# Patient Record
Sex: Male | Born: 1990 | Race: White | Hispanic: No | Marital: Married | Smoking: Never smoker
Health system: Southern US, Community
[De-identification: ages and names within clinical notes are randomized; demographics above are authoritative.]

## PROBLEM LIST (undated history)

## (undated) DIAGNOSIS — Z87442 Personal history of urinary calculi: Secondary | ICD-10-CM

## (undated) DIAGNOSIS — E7800 Pure hypercholesterolemia, unspecified: Secondary | ICD-10-CM

## (undated) HISTORY — PX: WISDOM TOOTH EXTRACTION: SHX21

---
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
## 2018-04-27 ENCOUNTER — Ambulatory Visit: Payer: Self-pay | Admitting: Physician Assistant

## 2018-05-14 ENCOUNTER — Ambulatory Visit (INDEPENDENT_AMBULATORY_CARE_PROVIDER_SITE_OTHER): Payer: 59 | Admitting: Physician Assistant

## 2018-05-14 ENCOUNTER — Encounter: Payer: Self-pay | Admitting: Physician Assistant

## 2018-05-14 VITALS — BP 120/80 | HR 80 | Temp 98.1°F | Resp 16 | Ht 70.0 in | Wt 236.0 lb

## 2018-05-14 DIAGNOSIS — Z1329 Encounter for screening for other suspected endocrine disorder: Secondary | ICD-10-CM | POA: Diagnosis not present

## 2018-05-14 DIAGNOSIS — Z13220 Encounter for screening for lipoid disorders: Secondary | ICD-10-CM | POA: Diagnosis not present

## 2018-05-14 DIAGNOSIS — Z130 Encounter for screening for diseases of the blood and blood-forming organs and certain disorders involving the immune mechanism: Secondary | ICD-10-CM

## 2018-05-14 DIAGNOSIS — Z0000 Encounter for general adult medical examination without abnormal findings: Secondary | ICD-10-CM

## 2018-05-14 DIAGNOSIS — J020 Streptococcal pharyngitis: Secondary | ICD-10-CM

## 2018-05-14 DIAGNOSIS — Z114 Encounter for screening for human immunodeficiency virus [HIV]: Secondary | ICD-10-CM

## 2018-05-14 DIAGNOSIS — Z23 Encounter for immunization: Secondary | ICD-10-CM

## 2018-05-14 DIAGNOSIS — R21 Rash and other nonspecific skin eruption: Secondary | ICD-10-CM

## 2018-05-14 DIAGNOSIS — Z131 Encounter for screening for diabetes mellitus: Secondary | ICD-10-CM | POA: Diagnosis not present

## 2018-05-14 NOTE — Progress Notes (Signed)
Patient: Adam Wilcox, Male    DOB: 07-Feb-1991, 27 y.o.   MRN: [id number] Visit Date: 05/18/2018  Today's Provider: Trey Sailors, PA-C   Chief Complaint  Patient presents with  . New Patient (Initial Visit)   Subjective:    Annual physical exam  New patient   Patient here today to establish care, patient reports he has not had a PCP since leaving peds.  Patient reports he wants to get checked for thyroid and diabetes. Patient reports his mother has thyroid and diabetes. Lives in [place] with wife of one year. No children. He works in Nurse, learning disability.  Dr. Milford Cage at [facility] - vaccinated at this clinic.   Reports a rash on his lower extremities that has appeared in the last 1-2 months. 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 Does not itch, has never had this rash before. Red bumps on lower legs.   Also reports that he would like referral for tonsillectomy. Says he had recurrent strep throat as a child but then had braces so surgery was delayed and then never happened. Reports he gets strep throat 2 times a year since adolescence.  -----------------------------------------------------------------   Review of Systems  Constitutional: Negative.   HENT: Negative.   Respiratory: Negative.   Cardiovascular: Negative.   Gastrointestinal: Negative.   Endocrine: Negative.   Genitourinary: Negative.   Musculoskeletal: Negative.   Skin: Negative.   Allergic/Immunologic: Negative.   Neurological: Negative.   Hematological: Negative.   Psychiatric/Behavioral: Negative.     Social History      He  reports that he has never smoked. He has never used smokeless tobacco. He reports that he drinks about 3.0 standard drinks of alcohol per week. He reports that he does not use drugs.       Social History   Socioeconomic History  . Marital status: Married    Spouse name: Not on file  . Number of children: Not on file  . Years of education: Not on file  .  Highest education level: Not on file  Occupational History    Employer: [employer]  Social Needs  . Financial resource strain: Not on file  . Food insecurity:    Worry: Not on file    Inability: Not on file  . Transportation needs:    Medical: Not on file    Non-medical: Not on file  Tobacco Use  . Smoking status: Never Smoker  . Smokeless tobacco: Never Used  Substance and Sexual Activity  . Alcohol use: Yes    Alcohol/week: 3.0 standard drinks    Types: 1 Cans of beer, 1 Glasses of wine, 1 Shots of liquor per week  . Drug use: Never  . Sexual activity: Not on file  Lifestyle  . Physical activity:    Days per week: Not on file    Minutes per session: Not on file  . Stress: Not on file  Relationships  . Social connections:    Talks on phone: Not on file    Gets together: Not on file    Attends religious service: Not on file    Active member of club or organization: Not on file    Attends meetings of clubs or organizations: Not on file    Relationship status: Not on file  Other Topics Concern  . Not on file  Social History Narrative  . Not on file    History reviewed. No pertinent past medical history.  There are no active problems to display for this patient.   History reviewed. No pertinent surgical history.  Family History        Family Status  Relation Name Status  . Mother  Alive  . Father  Alive  . MGF  Deceased  . PGF  Deceased  . Brother  Alive        His family history includes Anemia in his mother; Cancer in his maternal grandfather and paternal grandfather; Diabetes in his mother; Thyroid disease in his mother.      No Known Allergies  No current outpatient medications on file.   Patient Care Team: Maryella Shivers as PCP - General (Physician Assistant)      Objective:   Vitals: BP 120/80 (BP Location: Left Arm, Patient Position: Sitting, Cuff Size: Large)   Pulse 80   Temp 98.1 F (36.7 C) (Oral)   Resp 16   Ht 5'  10" (1.778 m)   Wt 236 lb (107 kg)   SpO2 99%   BMI 33.86 kg/m    Vitals:   05/14/18 1509  BP: 120/80  Pulse: 80  Resp: 16  Temp: 98.1 F (36.7 C)  TempSrc: Oral  SpO2: 99%  Weight: 236 lb (107 kg)  Height: 5\' 10"  (1.778 m)     Physical Exam  Constitutional: He is oriented to person, place, and time. He appears well-developed and well-nourished.  HENT:  Right Ear: Tympanic membrane and external ear normal.  Left Ear: Tympanic membrane and external ear normal.  Mouth/Throat: Uvula is midline, oropharynx is clear and moist and mucous membranes are normal. No oropharyngeal exudate.  Cardiovascular: Normal rate and regular rhythm.  Pulmonary/Chest: Effort normal and breath sounds normal.  Abdominal: Soft. Bowel sounds are normal.  Neurological: He is alert and oriented to person, place, and time.  Skin: Skin is warm and dry.  Erythematous papules scattered on lower extremities.   Psychiatric: He has a normal mood and affect. His behavior is normal.     Depression Screen PHQ 2/9 Scores 05/14/2018  PHQ - 2 Score 0  PHQ- 9 Score 1      Assessment & Plan:     Routine Health Maintenance and Physical Exam  Exercise Activities and Dietary recommendations Goals   None     Immunization History  Administered Date(s) Administered  . Hepatitis B, adult 09/21/2017, 10/19/2017, 03/24/2018  . Influenza,inj,Quad PF,6+ Mos 09/24/2016, 05/14/2018  . Td 04/20/2009  . Tdap 04/20/2009    Health Maintenance  Topic Date Due  . TETANUS/TDAP  04/21/2019  . INFLUENZA VACCINE  Completed  . HIV Screening  Completed     Discussed health benefits of physical activity, and encouraged him to engage in regular exercise appropriate for his age and condition.    1. Annual physical exam   2. Diabetes mellitus screening  - Comprehensive Metabolic Panel (CMET)  3. Thyroid disorder screening  - TSH  4. Lipid screening  - Lipid Profile  5. Encounter for screening for  HIV  - HIV antibody (with reflex)  6. Screening for deficiency anemia  - CBC with Differential  7. Strep throat  - Ambulatory referral to ENT  8. Influenza vaccine needed  - Flu Vaccine QUAD 36+ mos IM  9. Rash and nonspecific skin eruption  Possibly contact dermatitis, advised 1% HC cream OTC.   The entirety of the information documented in the History of Present Illness, Review of Systems and Physical Exam were personally obtained by me.  Portions of this information were initially documented by Rondel BatonSulibeya Dimas, CMA and reviewed by me for thoroughness and accuracy.   Return in about 1 year (around 05/15/2019) for CPE.  --------------------------------------------------------------------    Trey SailorsAdriana M Atticus Wedin, PA-C  [facility]

## 2018-05-14 NOTE — Patient Instructions (Signed)

## 2018-05-15 LAB — CBC WITH DIFFERENTIAL/PLATELET
Basophils Absolute: 0 10*3/uL (ref 0.0–0.2)
Basos: 1 %
EOS (ABSOLUTE): 0.2 10*3/uL (ref 0.0–0.4)
Eos: 2 %
Hematocrit: 43.3 % (ref 37.5–51.0)
Hemoglobin: 14.9 g/dL (ref 13.0–17.7)
Immature Grans (Abs): 0 10*3/uL (ref 0.0–0.1)
Immature Granulocytes: 0 %
Lymphocytes Absolute: 2.4 10*3/uL (ref 0.7–3.1)
Lymphs: 30 %
MCH: 29.5 pg (ref 26.6–33.0)
MCHC: 34.4 g/dL (ref 31.5–35.7)
MCV: 86 fL (ref 79–97)
Monocytes Absolute: 0.6 10*3/uL (ref 0.1–0.9)
Monocytes: 8 %
Neutrophils Absolute: 4.9 10*3/uL (ref 1.4–7.0)
Neutrophils: 59 %
Platelets: 188 10*3/uL (ref 150–450)
RBC: 5.05 x10E6/uL (ref 4.14–5.80)
RDW: 13 % (ref 12.3–15.4)
WBC: 8.2 10*3/uL (ref 3.4–10.8)

## 2018-05-15 LAB — COMPREHENSIVE METABOLIC PANEL
ALT: 34 IU/L (ref 0–44)
AST: 27 IU/L (ref 0–40)
Albumin/Globulin Ratio: 1.7 (ref 1.2–2.2)
Albumin: 4.6 g/dL (ref 3.5–5.5)
Alkaline Phosphatase: 87 IU/L (ref 39–117)
BUN/Creatinine Ratio: 13 (ref 9–20)
BUN: 15 mg/dL (ref 6–20)
Bilirubin Total: 0.3 mg/dL (ref 0.0–1.2)
CO2: 17 mmol/L — ABNORMAL LOW (ref 20–29)
Calcium: 9.4 mg/dL (ref 8.7–10.2)
Chloride: 103 mmol/L (ref 96–106)
Creatinine, Ser: 1.18 mg/dL (ref 0.76–1.27)
GFR calc Af Amer: 99 mL/min/{1.73_m2} (ref >59)
GFR calc non Af Amer: 85 mL/min/{1.73_m2} (ref >59)
Globulin, Total: 2.7 g/dL (ref 1.5–4.5)
Glucose: 86 mg/dL (ref 65–99)
Potassium: 4 mmol/L (ref 3.5–5.2)
Sodium: 140 mmol/L (ref 134–144)
Total Protein: 7.3 g/dL (ref 6.0–8.5)

## 2018-05-15 LAB — LIPID PANEL
Chol/HDL Ratio: 6.3 ratio — ABNORMAL HIGH (ref 0.0–5.0)
Cholesterol, Total: 241 mg/dL — ABNORMAL HIGH (ref 100–199)
HDL: 38 mg/dL — ABNORMAL LOW (ref >39)
LDL Calculated: 167 mg/dL — ABNORMAL HIGH (ref 0–99)
Triglycerides: 179 mg/dL — ABNORMAL HIGH (ref 0–149)
VLDL Cholesterol Cal: 36 mg/dL (ref 5–40)

## 2018-05-15 LAB — TSH: TSH: 1.45 u[IU]/mL (ref 0.450–4.500)

## 2018-05-15 LAB — HIV ANTIBODY (ROUTINE TESTING W REFLEX): HIV Screen 4th Generation wRfx: NONREACTIVE

## 2018-05-18 ENCOUNTER — Telehealth: Payer: Self-pay

## 2018-05-18 NOTE — Telephone Encounter (Signed)
-----   Message from Trey Sailors, [place] sent at 05/18/2018 12:01 PM EDT ----- Cholesterol very elevated, not requiring treatment yet. However, recommend increased exercise, reduced saturated fats like red meats, butters, fried foods. Remaining labwork normal.

## 2018-05-18 NOTE — Telephone Encounter (Signed)
LMTCB  Thanks,  -Joseline 

## 2018-05-19 NOTE — Telephone Encounter (Signed)
Patient advised as directed below.  Thanks,  -Joseline 

## 2018-06-14 ENCOUNTER — Telehealth: Payer: Self-pay

## 2018-06-14 NOTE — Telephone Encounter (Signed)
Faxed ROI to [facility]. sd

## 2019-06-22 ENCOUNTER — Other Ambulatory Visit: Payer: Self-pay | Admitting: *Deleted

## 2019-06-22 DIAGNOSIS — Z20822 Contact with and (suspected) exposure to covid-19: Secondary | ICD-10-CM

## 2019-06-23 LAB — NOVEL CORONAVIRUS, NAA: SARS-CoV-2, NAA: NOT DETECTED

## 2020-05-11 ENCOUNTER — Encounter: Payer: Self-pay | Admitting: Physician Assistant

## 2020-05-18 ENCOUNTER — Other Ambulatory Visit: Payer: Self-pay

## 2020-05-18 ENCOUNTER — Encounter: Payer: Self-pay | Admitting: Physician Assistant

## 2020-05-18 ENCOUNTER — Ambulatory Visit (INDEPENDENT_AMBULATORY_CARE_PROVIDER_SITE_OTHER): Payer: BC Managed Care – PPO | Admitting: Physician Assistant

## 2020-05-18 VITALS — BP 116/80 | HR 68 | Temp 98.1°F | Ht 70.5 in | Wt 243.0 lb

## 2020-05-18 DIAGNOSIS — Z3141 Encounter for fertility testing: Secondary | ICD-10-CM

## 2020-05-18 DIAGNOSIS — L989 Disorder of the skin and subcutaneous tissue, unspecified: Secondary | ICD-10-CM

## 2020-05-18 DIAGNOSIS — Z23 Encounter for immunization: Secondary | ICD-10-CM

## 2020-05-18 DIAGNOSIS — Z0000 Encounter for general adult medical examination without abnormal findings: Secondary | ICD-10-CM

## 2020-05-18 DIAGNOSIS — Z2821 Immunization not carried out because of patient refusal: Secondary | ICD-10-CM

## 2020-05-18 NOTE — Progress Notes (Signed)
Complete physical exam   Patient: Adam Wilcox   DOB: [birth year]   29 y.o. Male  MRN: [id number] Visit Date: 05/18/2020  Today's healthcare provider: Trey Sailors, PA-C   Chief Complaint  Patient presents with  . Annual Exam   Subjective    Adam Wilcox is a 29 y.o. male who presents today for a complete physical exam.  He reports consuming a general diet. The patient does not participate in regular exercise at present. He generally feels well. He reports sleeping well. He does have additional problems to discuss today.    Patient reports that he and his wife are trying to have a baby and he wants to be referred for infertility workup. He does not currently have any children.   No past medical history on file. No past surgical history on file. Social History   Socioeconomic History  . Marital status: Married    Spouse name: Not on file  . Number of children: Not on file  . Years of education: Not on file  . Highest education level: Not on file  Occupational History    Employer: [employer]  Tobacco Use  . Smoking status: Never Smoker  . Smokeless tobacco: Never Used  Vaping Use  . Vaping Use: Never used  Substance and Sexual Activity  . Alcohol use: Yes    Alcohol/week: 3.0 standard drinks    Types: 1 Cans of beer, 1 Glasses of wine, 1 Shots of liquor per week  . Drug use: Never  . Sexual activity: Not on file  Other Topics Concern  . Not on file  Social History Narrative  . Not on file   Social Determinants of Health   Financial Resource Strain:   . Difficulty of Paying Living Expenses: Not on file  Food Insecurity:   . Worried About Programme researcher, broadcasting/film/video in the Last Year: Not on file  . Ran Out of Food in the Last Year: Not on file  Transportation Needs:   . Lack of Transportation (Medical): Not on file  . Lack of Transportation (Non-Medical): Not on file  Physical Activity:   . Days of Exercise per Week: Not on file  . Minutes of Exercise per  Session: Not on file  Stress:   . Feeling of Stress : Not on file  Social Connections:   . Frequency of Communication with Friends and Family: Not on file  . Frequency of Social Gatherings with Friends and Family: Not on file  . Attends Religious Services: Not on file  . Active Member of Clubs or Organizations: Not on file  . Attends Banker Meetings: Not on file  . Marital Status: Not on file  Intimate Partner Violence:   . Fear of Current or Ex-Partner: Not on file  . Emotionally Abused: Not on file  . Physically Abused: Not on file  . Sexually Abused: Not on file   Family Status  Relation Name Status  . Mother  Alive  . Father  Alive  . MGF  Deceased  . PGF  Deceased  . Brother  Alive   Family History  Problem Relation Age of Onset  . Diabetes Mother   . Thyroid disease Mother   . Anemia Mother   . Cancer Maternal Grandfather   . Cancer Paternal Grandfather    No Known Allergies  Patient Care Team: Maryella Shivers as PCP - General (Physician Assistant)   Medications: No outpatient medications prior  to visit.   No facility-administered medications prior to visit.    Review of Systems  Constitutional: Negative.   HENT: Negative.   Eyes: Negative.   Respiratory: Negative.   Cardiovascular: Negative.   Gastrointestinal: Negative.   Endocrine: Negative.   Genitourinary: Negative.   Musculoskeletal: Negative.   Skin: Negative.   Allergic/Immunologic: Negative.   Neurological: Negative.   Hematological: Negative.   Psychiatric/Behavioral: Negative.       Objective    BP 116/80 (BP Location: Right Arm)   Pulse 68   Temp 98.1 F (36.7 C)   Ht 5' 10.5" (1.791 m)   Wt 243 lb (110.2 kg)   BMI 34.37 kg/m    Physical Exam Constitutional:      Appearance: He is obese.  HENT:     Right Ear: Tympanic membrane normal.     Left Ear: Tympanic membrane normal.  Eyes:     Pupils: Pupils are equal, round, and reactive to light.    Cardiovascular:     Rate and Rhythm: Normal rate and regular rhythm.     Pulses: Normal pulses.     Heart sounds: Normal heart sounds.  Pulmonary:     Effort: Pulmonary effort is normal.     Breath sounds: Normal breath sounds.  Abdominal:     General: Bowel sounds are normal.  Skin:    General: Skin is warm and dry.  Neurological:     Mental Status: He is oriented to person, place, and time. Mental status is at baseline.  Psychiatric:        Mood and Affect: Mood normal.        Behavior: Behavior normal.       Last depression screening scores PHQ 2/9 Scores 05/14/2018  PHQ - 2 Score 0  PHQ- 9 Score 1   Last fall risk screening Fall Risk  05/14/2018  Falls in the past year? No   Last Audit-C alcohol use screening Alcohol Use Disorder Test (AUDIT) 05/14/2018  1. How often do you have a drink containing alcohol? 1  2. How many drinks containing alcohol do you have on a typical day when you are drinking? 0  3. How often do you have six or more drinks on one occasion? 1  AUDIT-C Score 2   A score of 3 or more in women, and 4 or more in men indicates increased risk for alcohol abuse, EXCEPT if all of the points are from question 1   No results found for any visits on 05/18/20.  Assessment & Plan    Routine Health Maintenance and Physical Exam  Exercise Activities and Dietary recommendations Goals   None     Immunization History  Administered Date(s) Administered  . Hepatitis B, adult 09/21/2017, 10/19/2017, 03/24/2018  . Influenza,inj,Quad PF,6+ Mos 09/24/2016, 05/14/2018  . Td 04/20/2009  . Tdap 04/20/2009    Health Maintenance  Topic Date Due  . Hepatitis C Screening  Never done  . COVID-19 Vaccine (1) Never done  . TETANUS/TDAP  04/21/2019  . INFLUENZA VACCINE  12/13/2020 (Originally 04/15/2020)  . HIV Screening  Completed    Discussed health benefits of physical activity, and encouraged him to engage in regular exercise appropriate for his age and  condition.  1. Annual physical exam  - TSH - Lipid panel - Comprehensive metabolic panel - CBC with Differential/Platelet  2. Need for tetanus booster  - Td : Tetanus/diphtheria >7yo Preservative  free  3. Fertility testing  - Ambulatory referral to Urology  4. Skin lesion   5. COVID-19 virus vaccination refused    Return in about 1 year (around 05/18/2021) for CPE .     ITrey Sailors, PA-C, have reviewed all documentation for this visit. The documentation on 05/30/20 for the exam, diagnosis, procedures, and orders are all accurate and complete.  The entirety of the information documented in the History of Present Illness, Review of Systems and Physical Exam were personally obtained by me. Portions of this information were initially documented by [facility] and reviewed by me for thoroughness and accuracy.     Maryella Shivers  [facility] [phone] (phone) [phone] (fax)  [facility]

## 2020-05-18 NOTE — Patient Instructions (Signed)

## 2020-05-19 LAB — CBC WITH DIFFERENTIAL/PLATELET
Basophils Absolute: 0.1 10*3/uL (ref 0.0–0.2)
Basos: 1 %
EOS (ABSOLUTE): 0.1 10*3/uL (ref 0.0–0.4)
Eos: 2 %
Hematocrit: 44.1 % (ref 37.5–51.0)
Hemoglobin: 15.3 g/dL (ref 13.0–17.7)
Immature Grans (Abs): 0 10*3/uL (ref 0.0–0.1)
Immature Granulocytes: 0 %
Lymphocytes Absolute: 2.3 10*3/uL (ref 0.7–3.1)
Lymphs: 32 %
MCH: 30.4 pg (ref 26.6–33.0)
MCHC: 34.7 g/dL (ref 31.5–35.7)
MCV: 88 fL (ref 79–97)
Monocytes Absolute: 0.5 10*3/uL (ref 0.1–0.9)
Monocytes: 7 %
Neutrophils Absolute: 4.2 10*3/uL (ref 1.4–7.0)
Neutrophils: 58 %
Platelets: 177 10*3/uL (ref 150–450)
RBC: 5.03 x10E6/uL (ref 4.14–5.80)
RDW: 12.7 % (ref 11.6–15.4)
WBC: 7.3 10*3/uL (ref 3.4–10.8)

## 2020-05-19 LAB — COMPREHENSIVE METABOLIC PANEL
ALT: 58 IU/L — ABNORMAL HIGH (ref 0–44)
AST: 28 IU/L (ref 0–40)
Albumin/Globulin Ratio: 1.6 (ref 1.2–2.2)
Albumin: 4.5 g/dL (ref 4.1–5.2)
Alkaline Phosphatase: 97 IU/L (ref 48–121)
BUN/Creatinine Ratio: 10 (ref 9–20)
BUN: 12 mg/dL (ref 6–20)
Bilirubin Total: 0.3 mg/dL (ref 0.0–1.2)
CO2: 24 mmol/L (ref 20–29)
Calcium: 9.4 mg/dL (ref 8.7–10.2)
Chloride: 102 mmol/L (ref 96–106)
Creatinine, Ser: 1.25 mg/dL (ref 0.76–1.27)
GFR calc Af Amer: 91 mL/min/{1.73_m2} (ref >59)
GFR calc non Af Amer: 78 mL/min/{1.73_m2} (ref >59)
Globulin, Total: 2.8 g/dL (ref 1.5–4.5)
Glucose: 87 mg/dL (ref 65–99)
Potassium: 4.1 mmol/L (ref 3.5–5.2)
Sodium: 138 mmol/L (ref 134–144)
Total Protein: 7.3 g/dL (ref 6.0–8.5)

## 2020-05-19 LAB — LIPID PANEL
Chol/HDL Ratio: 6 ratio — ABNORMAL HIGH (ref 0.0–5.0)
Cholesterol, Total: 228 mg/dL — ABNORMAL HIGH (ref 100–199)
HDL: 38 mg/dL — ABNORMAL LOW (ref >39)
LDL Chol Calc (NIH): 164 mg/dL — ABNORMAL HIGH (ref 0–99)
Triglycerides: 142 mg/dL (ref 0–149)
VLDL Cholesterol Cal: 26 mg/dL (ref 5–40)

## 2020-05-19 LAB — TSH: TSH: 2.11 u[IU]/mL (ref 0.450–4.500)

## 2020-05-22 ENCOUNTER — Other Ambulatory Visit: Payer: Self-pay | Admitting: Physician Assistant

## 2020-05-22 DIAGNOSIS — Z3169 Encounter for other general counseling and advice on procreation: Secondary | ICD-10-CM

## 2020-06-12 ENCOUNTER — Telehealth: Payer: Self-pay

## 2020-06-12 NOTE — Telephone Encounter (Unsigned)
Copied from CRM [id number]. Topic: Referral - Status >> Jun 12, 2020  4:16 PM Wyonia Hough E wrote: Reason for CRM: Pt called to see if a new referral was sent to infertility specialist since he was rejected by the first office/ please advise

## 2020-06-18 NOTE — Telephone Encounter (Signed)
LMTCB

## 2020-08-17 ENCOUNTER — Telehealth: Payer: Self-pay

## 2020-08-17 ENCOUNTER — Encounter: Payer: Self-pay | Admitting: Physician Assistant

## 2020-08-17 NOTE — Telephone Encounter (Signed)
Copied from CRM [id number]. Topic: General - Other >> Aug 17, 2020 12:27 PM Daphine Deutscher D wrote: Reason for CRM: Pt called asking if the office has received the results from his fertility test and if so can someone call him back.  CB#[phone]

## 2020-08-17 NOTE — Telephone Encounter (Signed)
Patient was advised and states that he will look in his mychart for the results.

## 2020-08-17 NOTE — Telephone Encounter (Signed)
Results sent through MyChart

## 2021-05-21 ENCOUNTER — Encounter: Payer: Self-pay | Admitting: Physician Assistant

## 2022-05-23 NOTE — Progress Notes (Unsigned)
I,Tiffany J Bragg,acting as a scribe for [employer], MD.,have documented all relevant documentation on the behalf of Ronnald Ramp, MD,as directed by  Ronnald Ramp, MD while in the presence of Ronnald Ramp, MD.   Complete physical exam   Patient: Adam Wilcox   DOB: Mar 12, 1991   31 y.o. Male  MRN: [id number] Visit Date: 05/26/2022  Today's healthcare provider: Ronnald Ramp, MD   Chief Complaint  Patient presents with   Annual Exam   Knee Pain   Subjective    Adam Wilcox is a 31 y.o. male who presents today for a complete physical exam.   He reports consuming a general diet.  Exercise includes playing sports daily.  He generally feels well. He reports sleeping well. He does have additional problems to discuss today.   Patient also wants to have an area on the back of his R knee looked at that is painful off and on.   And bilateral knee caps pop out of place off and on for about 15 years. Has not seen ortho about it recently.   HPI  Ear Wax  Patient reports that he has had excessive ear wax build up since he was a teen  He states that he needs to clean his ears 3-4 times per week He has tried using debrox drops when his ears were so impacted that he had decreased hearing  He reports that he often has decreased hearing due to wax buildup He has not seen ear nose and throat for this in the past He denies decreased hearing today  Patellar Dislocation  This has been occurring since he was a teen  They dislocate easily  Last time was on the right knee one week ago while throwing baseball He reports that he straightened his leg and fell and the knee cap went back into place  Has some knee instability but stops activity before knee dislocated   Social Hx  Smoked 1-2 years, tobacco , reports 1/2 pack per day  Reports that drinks once per week at times, most of the time he drinks cider or will have shots of whiskey  He  reports that he rarely has 4 or more drinks in one sitting Negative CAGE questions   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 No pertinent past medical history. History reviewed. No pertinent surgical history. Social History   Socioeconomic History   Marital status: Married    Spouse name: Not on file   Number of children: Not on file   Years of education: Not on file   Highest education level: Not on file  Occupational History    Employer: [employer]  Tobacco Use   Smoking status: Never   Smokeless tobacco: Never  Vaping Use   Vaping Use: Never used  Substance and Sexual Activity   Alcohol use: Yes    Alcohol/week: 3.0 standard drinks of alcohol    Types: 1 Cans of beer, 1 Glasses of wine, 1 Shots of liquor per week   Drug use: Never   Sexual activity: Not on file  Other Topics Concern   Not on file  Social History Narrative   Not on file   Social Determinants of Health   Financial Resource Strain: Not on file  Food Insecurity: Not on file  Transportation Needs: Not on file  Physical Activity: Not on file  Stress: Not on file  Social Connections: Not on file  Intimate Partner Violence: Not on file  Family Status  Relation Name Status   Mother  Alive   Father  Alive   MGF  Deceased   PGF  Deceased   Brother  5   Family History  Problem Relation Age of Onset   Diabetes Mother    Thyroid disease Mother    Anemia Mother    Cancer Maternal Grandfather    Cancer Paternal Grandfather    No Known Allergies  Patient Care Team: Eulis Foster, MD as PCP - General (Family Medicine)   Medications: No outpatient medications prior to visit.   No facility-administered medications prior to visit.    Review of Systems  Constitutional:  Negative for appetite change, fatigue and fever.  HENT:  Negative for ear pain and hearing loss.        Cerumen in left ear    Eyes:  Negative for pain and redness.  Respiratory:   Negative for shortness of breath and wheezing.   Gastrointestinal:  Negative for abdominal pain.  Genitourinary:  Negative for dysuria.  Musculoskeletal:  Negative for gait problem.  Neurological:  Negative for dizziness and headaches.      Objective     BP 117/87 (BP Location: Right Arm, Patient Position: Sitting, Cuff Size: Large)   Pulse (!) 57   Temp 97.6 F (36.4 C) (Oral)   Resp 16   Ht 5\' 10"  (1.778 m)   Wt 267 lb (121.1 kg)   SpO2 98%   BMI 38.31 kg/m     Physical Exam Vitals reviewed.  Constitutional:      General: He is not in acute distress.    Appearance: Normal appearance. He is not ill-appearing, toxic-appearing or diaphoretic.  HENT:     Head: Normocephalic and atraumatic.     Right Ear: Tympanic membrane and external ear normal. There is no impacted cerumen.     Left Ear: Tympanic membrane and external ear normal. There is impacted cerumen.     Nose: Nose normal.     Mouth/Throat:     Mouth: Mucous membranes are moist.     Pharynx: No oropharyngeal exudate or posterior oropharyngeal erythema.  Eyes:     Extraocular Movements: Extraocular movements intact.     Conjunctiva/sclera: Conjunctivae normal.     Pupils: Pupils are equal, round, and reactive to light.  Cardiovascular:     Rate and Rhythm: Normal rate and regular rhythm.     Pulses: Normal pulses.     Heart sounds: Normal heart sounds. No murmur heard. Pulmonary:     Effort: Pulmonary effort is normal. No respiratory distress.     Breath sounds: Normal breath sounds. No wheezing or rales.  Abdominal:     General: Bowel sounds are normal. There is no distension.     Palpations: Abdomen is soft.     Tenderness: There is no abdominal tenderness.  Musculoskeletal:        General: No swelling, tenderness or signs of injury. Normal range of motion.     Cervical back: Normal range of motion and neck supple.     Right lower leg: No edema.     Left lower leg: No edema.     Comments: No swelling,  erythema of bilateral knee joints  Normal location of bilateral patella  Normal ROM of bilateral knees  5/5 strength of bilateral Lower extremities   Skin:    General: Skin is warm and dry.     Findings: No erythema or rash.  Neurological:     Mental Status:  He is alert and oriented to person, place, and time.     Cranial Nerves: No cranial nerve deficit.     Motor: No weakness.     Gait: Gait normal.    Right LE posterior knee lesion   Last depression screening scores    05/26/2022    8:57 AM 05/18/2020    4:43 PM 05/14/2018    2:57 PM  PHQ 2/9 Scores  PHQ - 2 Score 0 0 0  PHQ- 9 Score 0  1   Last fall risk screening    05/26/2022    8:57 AM  [place] in the past year? 0  Number falls in past yr: 0  Injury with Fall? 0  Risk for fall due to : No Fall Risks  Follow up Falls evaluation completed   Last Audit-C alcohol use screening    05/26/2022    8:58 AM  Alcohol Use Disorder Test (AUDIT)  1. How often do you have a drink containing alcohol? 2  2. How many drinks containing alcohol do you have on a typical day when you are drinking? 0  3. How often do you have six or more drinks on one occasion? 1  AUDIT-C Score 3   A score of 3 or more in women, and 4 or more in men indicates increased risk for alcohol abuse, EXCEPT if all of the points are from question 1   No results found for any visits on 05/26/22.  Assessment & Plan     Routine Health Maintenance and Physical Exam   Immunization History  Administered Date(s) Administered   Hepatitis B, adult 09/21/2017, 10/19/2017, 03/24/2018   Influenza,inj,Quad PF,6+ Mos 09/24/2016, 05/14/2018   Td 04/20/2009, 05/18/2020   Tdap 04/20/2009    Health Maintenance  Topic Date Due   COVID-19 Vaccine (1) Never done   Hepatitis C Screening  Never done   INFLUENZA VACCINE  04/15/2022   TETANUS/TDAP  05/18/2030   HIV Screening  Completed   HPV VACCINES  Aged Out    Discussed health benefits of physical  activity, and encouraged him to engage in regular exercise appropriate for his age and condition.  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  Recommended wearing supportive patellar brace with activity  Will refer patient to orthopedics for further evaluation given sensation of knee instability and dislocation of patellas        Relevant Orders   Ambulatory referral to Orthopedic Surgery     Other   Encounter for hepatitis C screening test for low risk patient    Hep C screening       Relevant Orders   Hepatitis C Antibody   BMI 38.0-38.9,adult    CMP obtained  Lipid panel collected today       Relevant Orders   Comprehensive metabolic panel   Lipid panel   Annual physical exam - Primary    Reviewed diet, exercise and medical problems  Addressed additional problems as noted  above  Recommended for annual physical in 1 year  HM addressed: Hep C screening, lipid panel and CMP for BMI 38       Other Visit Diagnoses     Skin lesion of right leg       Relevant Orders   Ambulatory referral to Dermatology        Return in 1 year (on 05/27/2023) for physical .     The entirety of the information documented in the History of Present Illness, Review of Systems and Physical Exam were personally obtained by me, Brenisha Tsui Simmons-Robinson. Portions of this information were initially documented by the CMA and reviewed by me for thoroughness and accuracy.     Ronnald Ramp, MD  [facility] [phone]  (phone) [phone] (fax)  [facility]

## 2022-05-26 ENCOUNTER — Ambulatory Visit (INDEPENDENT_AMBULATORY_CARE_PROVIDER_SITE_OTHER): Payer: No Typology Code available for payment source | Admitting: Family Medicine

## 2022-05-26 ENCOUNTER — Encounter: Payer: Self-pay | Admitting: Family Medicine

## 2022-05-26 VITALS — BP 117/87 | HR 57 | Temp 97.6°F | Resp 16 | Ht 70.0 in | Wt 267.0 lb

## 2022-05-26 DIAGNOSIS — Z0000 Encounter for general adult medical examination without abnormal findings: Secondary | ICD-10-CM

## 2022-05-26 DIAGNOSIS — Z6838 Body mass index (BMI) 38.0-38.9, adult: Secondary | ICD-10-CM | POA: Insufficient documentation

## 2022-05-26 DIAGNOSIS — S83004A Unspecified dislocation of right patella, initial encounter: Secondary | ICD-10-CM

## 2022-05-26 DIAGNOSIS — Z1159 Encounter for screening for other viral diseases: Secondary | ICD-10-CM | POA: Insufficient documentation

## 2022-05-26 DIAGNOSIS — H6122 Impacted cerumen, left ear: Secondary | ICD-10-CM

## 2022-05-26 DIAGNOSIS — L989 Disorder of the skin and subcutaneous tissue, unspecified: Secondary | ICD-10-CM

## 2022-05-26 NOTE — Assessment & Plan Note (Signed)
CMP obtained  Lipid panel collected today

## 2022-05-26 NOTE — Patient Instructions (Signed)
We have submitted a referral to orthopedics for further evaluation of your knee dislocations.   Please be on the lookout for a call to schedule this appointment.   I have also submitted a dermatology referral for the area on the back of your leg.   Please follow up for your next physical in one year.   Earwax Buildup, Adult The ears produce a substance called earwax that helps keep bacteria out of the ear and protects the skin in the ear canal. Occasionally, earwax can build up in the ear and cause discomfort or hearing loss. What are the causes? This condition is caused by a buildup of earwax. Ear canals are self-cleaning. Ear wax is made in the outer part of the ear canal and generally falls out in small amounts over time. When the self-cleaning mechanism is not working, earwax builds up and can cause decreased hearing and discomfort. Attempting to clean ears with cotton swabs can push the earwax deep into the ear canal and cause decreased hearing and pain. What increases the risk? This condition is more likely to develop in people who: Clean their ears often with cotton swabs. Pick at their ears. Use earplugs or in-ear headphones often, or wear hearing aids. The following factors may also make you more likely to develop this condition: Being male. Being of older age. Naturally producing more earwax. Having narrow ear canals. Having earwax that is overly thick or sticky. Having excess hair in the ear canal. Having eczema. Being dehydrated. What are the signs or symptoms? Symptoms of this condition include: Reduced or muffled hearing. A feeling of fullness in the ear or feeling that the ear is plugged. Fluid coming from the ear. Ear pain or an itchy ear. Ringing in the ear. Coughing. Balance problems. An obvious piece of earwax that can be seen inside the ear canal. How is this diagnosed? This condition may be diagnosed based on: Your symptoms. Your medical history. An ear  exam. During the exam, your health care provider will look into your ear with an instrument called an otoscope. You may have tests, including a hearing test. How is this treated? This condition may be treated by: Using ear drops to soften the earwax. Having the earwax removed by a health care provider. The health care provider may: Flush the ear with water. Use an instrument that has a loop on the end (curette). Use a suction device. Having surgery to remove the wax buildup. This may be done in severe cases. Follow these instructions at home:  Take over-the-counter and prescription medicines only as told by your health care provider. Do not put any objects, including cotton swabs, into your ear. You can clean the opening of your ear canal with a washcloth or facial tissue. Follow instructions from your health care provider about cleaning your ears. Do not overclean your ears. Drink enough fluid to keep your urine pale yellow. This will help to thin the earwax. Keep all follow-up visits as told. If earwax builds up in your ears often or if you use hearing aids, consider seeing your health care provider for routine, preventive ear cleanings. Ask your health care provider how often you should schedule your cleanings. If you have hearing aids, clean them according to instructions from the manufacturer and your health care provider. Contact a health care provider if: You have ear pain. You develop a fever. You have pus or other fluid coming from your ear. You have hearing loss. You have ringing in your  ears that does not go away. You feel like the room is spinning (vertigo). Your symptoms do not improve with treatment. Get help right away if: You have bleeding from the affected ear. You have severe ear pain. Summary Earwax can build up in the ear and cause discomfort or hearing loss. The most common symptoms of this condition include reduced or muffled hearing, a feeling of fullness in the  ear, or feeling that the ear is plugged. This condition may be diagnosed based on your symptoms, your medical history, and an ear exam. This condition may be treated by using ear drops to soften the earwax or by having the earwax removed by a health care provider. Do not put any objects, including cotton swabs, into your ear. You can clean the opening of your ear canal with a washcloth or facial tissue. This information is not intended to replace advice given to you by your health care provider. Make sure you discuss any questions you have with your health care provider. Document Revised: 12/20/2019 Document Reviewed: 12/20/2019 Elsevier Patient Education  2023 [employer].

## 2022-05-26 NOTE — Assessment & Plan Note (Signed)
Chronic, intermittent dislocation  Reports hx of bilateral dislocation  Recommended wearing supportive patellar brace with activity  Will refer patient to orthopedics for further evaluation given sensation of knee instability and dislocation of patellas

## 2022-05-26 NOTE — Assessment & Plan Note (Signed)
Reviewed diet, exercise and medical problems  Addressed additional problems as noted above  Recommended for annual physical in 1 year  HM addressed: Hep C screening, lipid panel and CMP for BMI 38

## 2022-05-26 NOTE — Assessment & Plan Note (Signed)
Chronic, intermittent problem  Soft cerumen visualized on exam  Removed using ear curettage   Recommended daily Debrox drops 20 mins before evening shower for next five days to help with softening and removal of cerumen

## 2022-05-26 NOTE — Assessment & Plan Note (Signed)
Hep C screening.

## 2022-05-27 LAB — COMPREHENSIVE METABOLIC PANEL
ALT: 36 IU/L (ref 0–44)
AST: 23 IU/L (ref 0–40)
Albumin/Globulin Ratio: 1.8 (ref 1.2–2.2)
Albumin: 4.3 g/dL (ref 4.3–5.2)
Alkaline Phosphatase: 94 IU/L (ref 44–121)
BUN/Creatinine Ratio: 11 (ref 9–20)
BUN: 12 mg/dL (ref 6–20)
Bilirubin Total: 0.4 mg/dL (ref 0.0–1.2)
CO2: 23 mmol/L (ref 20–29)
Calcium: 9.2 mg/dL (ref 8.7–10.2)
Chloride: 102 mmol/L (ref 96–106)
Creatinine, Ser: 1.08 mg/dL (ref 0.76–1.27)
Globulin, Total: 2.4 g/dL (ref 1.5–4.5)
Glucose: 102 mg/dL — ABNORMAL HIGH (ref 70–99)
Potassium: 4.4 mmol/L (ref 3.5–5.2)
Sodium: 139 mmol/L (ref 134–144)
Total Protein: 6.7 g/dL (ref 6.0–8.5)
eGFR: 95 mL/min/{1.73_m2} (ref >59)

## 2022-05-27 LAB — LIPID PANEL
Chol/HDL Ratio: 6 ratio — ABNORMAL HIGH (ref 0.0–5.0)
Cholesterol, Total: 223 mg/dL — ABNORMAL HIGH (ref 100–199)
HDL: 37 mg/dL — ABNORMAL LOW (ref >39)
LDL Chol Calc (NIH): 164 mg/dL — ABNORMAL HIGH (ref 0–99)
Triglycerides: 122 mg/dL (ref 0–149)
VLDL Cholesterol Cal: 22 mg/dL (ref 5–40)

## 2022-05-27 LAB — HEPATITIS C ANTIBODY: Hep C Virus Ab: NONREACTIVE

## 2022-11-24 ENCOUNTER — Other Ambulatory Visit: Payer: Self-pay

## 2022-11-24 ENCOUNTER — Emergency Department (HOSPITAL_COMMUNITY)
Admission: EM | Admit: 2022-11-24 | Discharge: 2022-11-25 | Disposition: A | Discharge status: Home / Self Care | Payer: No Typology Code available for payment source | Attending: Emergency Medicine | Admitting: Emergency Medicine

## 2022-11-24 DIAGNOSIS — N201 Calculus of ureter: Secondary | ICD-10-CM | POA: Diagnosis not present

## 2022-11-24 DIAGNOSIS — N23 Unspecified renal colic: Secondary | ICD-10-CM

## 2022-11-24 DIAGNOSIS — R109 Unspecified abdominal pain: Secondary | ICD-10-CM | POA: Diagnosis present

## 2022-11-24 LAB — COMPREHENSIVE METABOLIC PANEL
ALT: 40 U/L (ref 0–44)
AST: 32 U/L (ref 15–41)
Albumin: 4.2 g/dL (ref 3.5–5.0)
Alkaline Phosphatase: 76 U/L (ref 38–126)
Anion gap: 10 (ref 5–15)
BUN: 11 mg/dL (ref 6–20)
CO2: 27 mmol/L (ref 22–32)
Calcium: 9.7 mg/dL (ref 8.9–10.3)
Chloride: 103 mmol/L (ref 98–111)
Creatinine, Ser: 1.24 mg/dL (ref 0.61–1.24)
GFR, Estimated: >60 mL/min (ref >60)
Glucose, Bld: 116 mg/dL — ABNORMAL HIGH (ref 70–99)
Potassium: 4.1 mmol/L (ref 3.5–5.1)
Sodium: 140 mmol/L (ref 135–145)
Total Bilirubin: 0.4 mg/dL (ref 0.3–1.2)
Total Protein: 7.3 g/dL (ref 6.5–8.1)

## 2022-11-24 LAB — URINALYSIS, ROUTINE W REFLEX MICROSCOPIC
Bilirubin Urine: NEGATIVE
Glucose, UA: NEGATIVE mg/dL
Ketones, ur: NEGATIVE mg/dL
Leukocytes,Ua: NEGATIVE
Nitrite: NEGATIVE
Protein, ur: 100 mg/dL — AB
Specific Gravity, Urine: 1.018 (ref 1.005–1.030)
pH: 6 (ref 5.0–8.0)

## 2022-11-24 LAB — CBC WITH DIFFERENTIAL/PLATELET
Abs Immature Granulocytes: 0.03 10*3/uL (ref 0.00–0.07)
Basophils Absolute: 0.1 10*3/uL (ref 0.0–0.1)
Basophils Relative: 1 %
Eosinophils Absolute: 0.1 10*3/uL (ref 0.0–0.5)
Eosinophils Relative: 1 %
HCT: 43.8 % (ref 39.0–52.0)
Hemoglobin: 15 g/dL (ref 13.0–17.0)
Immature Granulocytes: 0 %
Lymphocytes Relative: 19 %
Lymphs Abs: 2 10*3/uL (ref 0.7–4.0)
MCH: 30.4 pg (ref 26.0–34.0)
MCHC: 34.2 g/dL (ref 30.0–36.0)
MCV: 88.7 fL (ref 80.0–100.0)
Monocytes Absolute: 0.7 10*3/uL (ref 0.1–1.0)
Monocytes Relative: 7 %
Neutro Abs: 7.5 10*3/uL (ref 1.7–7.7)
Neutrophils Relative %: 72 %
Platelets: 173 10*3/uL (ref 150–400)
RBC: 4.94 MIL/uL (ref 4.22–5.81)
RDW: 13 % (ref 11.5–15.5)
WBC: 10.3 10*3/uL (ref 4.0–10.5)
nRBC: 0 % (ref 0.0–0.2)

## 2022-11-24 LAB — URINALYSIS, MICROSCOPIC (REFLEX)
Bacteria, UA: NONE SEEN
RBC / HPF: >50 RBC/hpf (ref 0–5)

## 2022-11-24 MED ORDER — OXYCODONE-ACETAMINOPHEN 5-325 MG PO TABS
1.0000 | ORAL_TABLET | Freq: Once | ORAL | Status: AC
  Administered 2022-11-24: 1 via ORAL
  Filled 2022-11-24: qty 1

## 2022-11-24 NOTE — ED Triage Notes (Signed)
Patient reports right flank pain with hematuria this evening , no emesis or fever .

## 2022-11-25 ENCOUNTER — Emergency Department (HOSPITAL_COMMUNITY): Payer: No Typology Code available for payment source

## 2022-11-25 MED ORDER — OXYCODONE-ACETAMINOPHEN 5-325 MG PO TABS: 1.0000 | ORAL_TABLET | ORAL | 0 refills | Status: DC | PRN

## 2022-11-25 MED ORDER — TAMSULOSIN HCL 0.4 MG PO CAPS: 0.4000 mg | ORAL_CAPSULE | Freq: Every day | ORAL | 0 refills | Status: AC

## 2022-11-25 NOTE — ED Notes (Signed)
Patient to CT scan.  Denies need for pain medications

## 2022-11-25 NOTE — ED Provider Notes (Signed)
[facility] Provider Note   CSN: [id number] Arrival date & time: 11/24/22  2232     History  Chief Complaint  Patient presents with   R-Flank pain / Hematuria    Adam Wilcox is a 32 y.o. male.  Patient presents to the emergency department with right flank pain.  Patient reports onset of pain this evening.  Pain was severe initially and he has noticed blood in his urine tonight.  Patient does report that the pain has eased off while in the waiting room.  No fever, nausea, vomiting, dysuria.       Home Medications Prior to Admission medications   Medication Sig Start Date End Date Taking? Authorizing Provider  tamsulosin (FLOMAX) 0.4 MG CAPS capsule Take 1 capsule (0.4 mg total) by mouth daily. 11/25/22  Yes Mareta Chesnut, Gwenyth Allegra, MD  oxyCODONE-acetaminophen (PERCOCET) 5-325 MG tablet Take 1-2 tablets by mouth every 4 (four) hours as needed. 11/25/22  Yes Onesha Krebbs, Gwenyth Allegra, MD      Allergies    Patient has no known allergies.    Review of Systems   Review of Systems  Physical Exam Updated Vital Signs BP 119/83   Pulse (!) 56   Temp (!) 97.5 F (36.4 C) (Oral)   Resp 17   SpO2 100%  Physical Exam Vitals and nursing note reviewed.  Constitutional:      General: He is not in acute distress.    Appearance: He is well-developed.  HENT:     Head: Normocephalic and atraumatic.     Mouth/Throat:     Mouth: Mucous membranes are moist.  Eyes:     General: Vision grossly intact. Gaze aligned appropriately.     Extraocular Movements: Extraocular movements intact.     Conjunctiva/sclera: Conjunctivae normal.  Cardiovascular:     Rate and Rhythm: Normal rate and regular rhythm.     Pulses: Normal pulses.     Heart sounds: Normal heart sounds, S1 normal and S2 normal. No murmur heard.    No friction rub. No gallop.  Pulmonary:     Effort: Pulmonary effort is normal. No respiratory distress.     Breath sounds: Normal  breath sounds.  Abdominal:     Palpations: Abdomen is soft.     Tenderness: There is no abdominal tenderness. There is no guarding or rebound.     Hernia: No hernia is present.  Musculoskeletal:        General: No swelling.     Cervical back: Full passive range of motion without pain, normal range of motion and neck supple. No pain with movement, spinous process tenderness or muscular tenderness. Normal range of motion.     Right lower leg: No edema.     Left lower leg: No edema.  Skin:    General: Skin is warm and dry.     Capillary Refill: Capillary refill takes less than 2 seconds.     Findings: No ecchymosis, erythema, lesion or wound.  Neurological:     Mental Status: He is alert and oriented to person, place, and time.     GCS: GCS eye subscore is 4. GCS verbal subscore is 5. GCS motor subscore is 6.     Cranial Nerves: Cranial nerves 2-12 are intact.     Sensory: Sensation is intact.     Motor: Motor function is intact. No weakness or abnormal muscle tone.     Coordination: Coordination is intact.  Psychiatric:  Mood and Affect: Mood normal.        Speech: Speech normal.        Behavior: Behavior normal.     ED Results / Procedures / Treatments   Labs (all labs ordered are listed, but only abnormal results are displayed) Labs Reviewed  COMPREHENSIVE METABOLIC PANEL - Abnormal; Notable for the following components:      Result Value   Glucose, Bld 116 (*)    All other components within normal limits  URINALYSIS, ROUTINE W REFLEX MICROSCOPIC - Abnormal; Notable for the following components:   Color, Urine RED (*)    APPearance CLOUDY (*)    Hgb urine dipstick LARGE (*)    Protein, ur 100 (*)    All other components within normal limits  CBC WITH DIFFERENTIAL/PLATELET  URINALYSIS, MICROSCOPIC (REFLEX)    EKG None  Radiology CT RENAL STONE STUDY  Result Date: 11/25/2022 CLINICAL DATA:  Abdominal/flank pain with stone suspected. Right flank pain with  hematuria EXAM: CT ABDOMEN AND PELVIS WITHOUT CONTRAST TECHNIQUE: Multidetector CT imaging of the abdomen and pelvis was performed following the standard protocol without IV contrast. RADIATION DOSE REDUCTION: This exam was performed according to the departmental dose-optimization program which includes automated exposure control, adjustment of the mA and/or kV according to patient size and/or use of iterative reconstruction technique. COMPARISON:  None Available. FINDINGS: Lower chest:  No contributory findings. Hepatobiliary: No focal liver abnormality.No evidence of biliary obstruction or stone. Pancreas: Unremarkable. Spleen: Unremarkable. Adrenals/Urinary Tract: Negative adrenals. 8 x 4 mm stone at the right UPJ. Mild periureteric fat stranding. No hydronephrosis. No additional calculus. Unremarkable bladder. Stomach/Bowel:  No obstruction. No appendicitis. Vascular/Lymphatic: No acute vascular abnormality. No mass or adenopathy. Reproductive:No pathologic findings. Other: No ascites or pneumoperitoneum. Musculoskeletal: No acute abnormalities. IMPRESSION: 8 x 4 mm stone at the right UPJ.  No hydronephrosis. Electronically Signed   By: Jorje Guild M.D.   On: 11/25/2022 05:35    Procedures Procedures    Medications Ordered in ED Medications  oxyCODONE-acetaminophen (PERCOCET/ROXICET) 5-325 MG per tablet 1 tablet (1 tablet Oral Given 11/24/22 2246)    ED Course/ Medical Decision Making/ A&P                             Medical Decision Making Amount and/or Complexity of Data Reviewed Labs: ordered. Radiology: ordered.  Risk Prescription drug management.   Differential Diagnosis considered includes, but not limited to: Renal colic/kidney stone; pyelonephritis; aortic dissection; musculoskeletal pain.   Presents with right flank pain.  Patient has noticed blood in the urine.  Urinalysis does show hematuria without signs of infection.  Patient was experiencing fairly severe pain earlier  but this has eased off and is now resolved without intervention other than Percocet.  CT scan does show proximal 4 x 8 mm obstructive stone.  As patient is currently pain-free, will discharge with analgesia, Flomax, follow-up with urology.  Given return precautions.        Final Clinical Impression(s) / ED Diagnoses Final diagnoses:  Renal colic on right side    Rx / DC Orders ED Discharge Orders          Ordered    oxyCODONE-acetaminophen (PERCOCET) 5-325 MG tablet  Every 4 hours PRN        11/25/22 0631    tamsulosin (FLOMAX) 0.4 MG CAPS capsule  Daily        11/25/22 [id number]  Orpah Greek, MD 11/25/22 [phone]

## 2022-12-31 ENCOUNTER — Other Ambulatory Visit: Payer: Self-pay | Admitting: Urology

## 2023-01-01 NOTE — Progress Notes (Signed)
Left message for patient to return phone call.  

## 2023-01-02 ENCOUNTER — Encounter (HOSPITAL_BASED_OUTPATIENT_CLINIC_OR_DEPARTMENT_OTHER): Payer: Self-pay | Admitting: Urology

## 2023-01-02 NOTE — Progress Notes (Addendum)
Left message for patient. Instructed to arrive 0915 Monday, NPO after midnight. Instructed pt to review information in blue folder from [place] stone, no NSAIDs/blood thinners, to bring license/insurance, and to take laxative Sunday. Instructed pt will need adult with him for 24 hours after.   Pt returned call. Reviewed allergies, medications, and history. Pt to arrive at 0915, location reviewed. Pt aware of pre-op instructions and to bring blue folder day of. Spouse to be driver.

## 2023-01-02 NOTE — Progress Notes (Signed)
Left message for patient to return phone call for pre-procedure instructions, procedure on 01/05/2023.

## 2023-01-05 ENCOUNTER — Ambulatory Visit (HOSPITAL_BASED_OUTPATIENT_CLINIC_OR_DEPARTMENT_OTHER)
Admission: RE | Admit: 2023-01-05 | Discharge: 2023-01-05 | Disposition: A | Discharge status: Home / Self Care | Payer: No Typology Code available for payment source | Attending: Urology | Admitting: Urology

## 2023-01-05 ENCOUNTER — Other Ambulatory Visit: Payer: Self-pay

## 2023-01-05 ENCOUNTER — Encounter (HOSPITAL_BASED_OUTPATIENT_CLINIC_OR_DEPARTMENT_OTHER)
Admission: RE | Disposition: A | Discharge status: Home / Self Care | Payer: Self-pay | Source: Home / Self Care | Attending: Urology

## 2023-01-05 ENCOUNTER — Encounter (HOSPITAL_BASED_OUTPATIENT_CLINIC_OR_DEPARTMENT_OTHER): Payer: Self-pay | Admitting: Urology

## 2023-01-05 ENCOUNTER — Ambulatory Visit (HOSPITAL_COMMUNITY): Payer: No Typology Code available for payment source

## 2023-01-05 DIAGNOSIS — N201 Calculus of ureter: Secondary | ICD-10-CM | POA: Insufficient documentation

## 2023-01-05 DIAGNOSIS — E669 Obesity, unspecified: Secondary | ICD-10-CM | POA: Insufficient documentation

## 2023-01-05 DIAGNOSIS — Z6839 Body mass index (BMI) 39.0-39.9, adult: Secondary | ICD-10-CM | POA: Insufficient documentation

## 2023-01-05 HISTORY — PX: EXTRACORPOREAL SHOCK WAVE LITHOTRIPSY: SHX1557

## 2023-01-05 HISTORY — DX: Pure hypercholesterolemia, unspecified: E78.00

## 2023-01-05 HISTORY — DX: Personal history of urinary calculi: Z87.442

## 2023-01-05 SURGERY — LITHOTRIPSY, ESWL
Anesthesia: LOCAL | Laterality: Right

## 2023-01-05 MED ORDER — DIPHENHYDRAMINE HCL 25 MG PO CAPS
ORAL_CAPSULE | ORAL | Status: AC
  Filled 2023-01-05: qty 1

## 2023-01-05 MED ORDER — CIPROFLOXACIN HCL 500 MG PO TABS
500.0000 mg | ORAL_TABLET | ORAL | Status: AC
  Administered 2023-01-05: 500 mg via ORAL

## 2023-01-05 MED ORDER — KETOROLAC TROMETHAMINE 60 MG/2ML IM SOLN: 30.0000 mg | Freq: Once | INTRAMUSCULAR | Status: AC

## 2023-01-05 MED ORDER — CIPROFLOXACIN HCL 500 MG PO TABS
ORAL_TABLET | ORAL | Status: AC
  Filled 2023-01-05: qty 1

## 2023-01-05 MED ORDER — OXYCODONE-ACETAMINOPHEN 5-325 MG PO TABS: 1.0000 | ORAL_TABLET | Freq: Four times a day (QID) | ORAL | 0 refills | Status: AC | PRN

## 2023-01-05 MED ORDER — SODIUM CHLORIDE 0.9 % IV SOLN: INTRAVENOUS | Status: DC

## 2023-01-05 MED ORDER — DIPHENHYDRAMINE HCL 25 MG PO CAPS
25.0000 mg | ORAL_CAPSULE | ORAL | Status: AC
  Administered 2023-01-05: 25 mg via ORAL

## 2023-01-05 MED ORDER — DIAZEPAM 5 MG PO TABS
ORAL_TABLET | ORAL | Status: AC
  Filled 2023-01-05: qty 2

## 2023-01-05 MED ORDER — DIAZEPAM 5 MG PO TABS
10.0000 mg | ORAL_TABLET | ORAL | Status: AC
  Administered 2023-01-05: 10 mg via ORAL

## 2023-01-05 NOTE — H&P (Signed)
H&P  Chief Complaint: Rt sided kidney stone  History of Present Illness: 32 yo male is here for ESL of an 8 mm Rt ureteral stone.  Past Medical History:  Diagnosis Date   History of kidney stones    Hypercholesterolemia     Past Surgical History:  Procedure Laterality Date   WISDOM TOOTH EXTRACTION     age 45    Home Medications:    Allergies: No Known Allergies  Family History  Problem Relation Age of Onset   Diabetes Mother    Thyroid disease Mother    Anemia Mother    Cancer Maternal Grandfather    Cancer Paternal Grandfather     Social History:  reports that he has never smoked. He has never used smokeless tobacco. He reports current alcohol use of about 3.0 standard drinks of alcohol per week. He reports that he does not use drugs.  ROS: A complete review of systems was performed.  All systems are negative except for pertinent findings as noted.  Physical Exam:  Vital signs in last 24 hours: BP (!) 148/79   Pulse 65   Temp (!) 97.5 F (36.4 C) (Oral)   Resp 18   Ht  (1.778 m)   Wt 125.2 kg   SpO2 99%   BMI 39.60 kg/m  Constitutional:  Alert and oriented, No acute distress Cardiovascular: Regular rate  Respiratory: Normal respiratory effort Neurologic: Grossly intact, no focal deficits Psychiatric: Normal mood and affect  I have reviewed prior pt notes  I have reviewed notes from referring/previous physicians  I have reviewed urinalysis results  I have independently reviewed prior imaging    Impression/Assessment:  8 mm Rt ureteral stone  Plan:  ESL--first treatment of a possible staged procedure

## 2023-01-05 NOTE — Interval H&P Note (Signed)
History and Physical Interval Note:  01/05/2023 10:59 AM  Adam Wilcox  has presented today for surgery, with the diagnosis of RIGHT URETERAL STONE.  The various methods of treatment have been discussed with the patient and family. After consideration of risks, benefits and other options for treatment, the patient has consented to  Procedure(s): RIGHT EXTRACORPOREAL SHOCK WAVE LITHOTRIPSY (ESWL) (Right) as a surgical intervention.  The patient's history has been reviewed, patient examined, no change in status, stable for surgery.  I have reviewed the patient's chart and labs.  Questions were answered to the patient's satisfaction.     Bertram Millard Kaysin Brock

## 2023-01-05 NOTE — Op Note (Signed)
See [facility] OP note scanned into chart. 

## 2023-01-05 NOTE — Discharge Instructions (Signed)
See [facility] discharge instructions in chart.  

## 2023-01-06 ENCOUNTER — Encounter (HOSPITAL_BASED_OUTPATIENT_CLINIC_OR_DEPARTMENT_OTHER): Payer: Self-pay | Admitting: Urology

## 2023-01-28 ENCOUNTER — Ambulatory Visit: Payer: No Typology Code available for payment source | Admitting: Dermatology

## 2023-12-22 DIAGNOSIS — I8310 Varicose veins of unspecified lower extremity with inflammation: Secondary | ICD-10-CM | POA: Insufficient documentation

## 2023-12-22 NOTE — Progress Notes (Signed)
 MRN : [id number]  Adam Wilcox is a 33 y.o. (03-21-1991) male who presents with chief complaint of varicose veins hurt.  History of Present Illness:   The patient is seen for evaluation of symptomatic varicose veins. The patient relates burning and stinging which worsened steadily throughout the course of the day, particularly with standing. The patient also notes an aching and throbbing pain over the varicosities, particularly with prolonged dependent positions. The symptoms are significantly improved with elevation.  The patient also notes that during hot weather the symptoms are greatly intensified. The patient states the pain from the varicose veins interferes with work, daily exercise, shopping and household maintenance. At this point, the symptoms are persistent and severe enough that they're having a negative impact on lifestyle and are interfering with daily activities.  There is no history of DVT, PE or superficial thrombophlebitis. There is no history of ulceration or hemorrhage. The patient denies a significant family history of varicose veins.  The patient has not worn graduated compression in the past. At the present time the patient has not been using over-the-counter analgesics. There is no history of prior surgical intervention or sclerotherapy.   No outpatient medications have been marked as taking for the 12/24/23 encounter (Appointment) with Gilda Crease, Latina Craver, MD.    Past Medical History:  Diagnosis Date   History of kidney stones    Hypercholesterolemia     Past Surgical History:  Procedure Laterality Date   EXTRACORPOREAL SHOCK WAVE LITHOTRIPSY Right 01/05/2023   Procedure: RIGHT EXTRACORPOREAL SHOCK WAVE LITHOTRIPSY (ESWL);  Surgeon: Marcine Matar, MD;  Location: [facility];  Service: Urology;  Laterality: Right;   WISDOM TOOTH EXTRACTION     age 66    Social History Social History   Tobacco Use   Smoking status: Never    Smokeless tobacco: Never  Vaping Use   Vaping status: Never Used  Substance Use Topics   Alcohol use: Yes    Alcohol/week: 3.0 standard drinks of alcohol    Types: 1 Cans of beer, 1 Glasses of wine, 1 Shots of liquor per week   Drug use: Never    Family History Family History  Problem Relation Age of Onset   Diabetes Mother    Thyroid disease Mother    Anemia Mother    Cancer Maternal Grandfather    Cancer Paternal Grandfather     No Known Allergies   REVIEW OF SYSTEMS (Negative unless checked)  Constitutional: [] Weight loss  [] Fever  [] Chills Cardiac: [] Chest pain   [] Chest pressure   [] Palpitations   [] Shortness of breath when laying flat   [] Shortness of breath with exertion. Vascular:  [] Pain in legs with walking   [x] Pain in legs with standing  [] History of DVT   [] Phlebitis   [] Swelling in legs   [x] Varicose veins   [] Non-healing ulcers Pulmonary:   [] Uses home oxygen   [] Productive cough   [] Hemoptysis   [] Wheeze  [] COPD   [] Asthma Neurologic:  [] Dizziness   [] Seizures   [] History of stroke   [] History of TIA  [] Aphasia   [] Vissual changes   [] Weakness or numbness in arm   [] Weakness or numbness in leg Musculoskeletal:   [] Joint swelling   [] Joint pain   [] Low back pain Hematologic:  [] Easy bruising  [] Easy bleeding   [] Hypercoagulable state   [] Anemic Gastrointestinal:  [] Diarrhea   [] Vomiting  [] Gastroesophageal reflux/heartburn   [] Difficulty swallowing.  Genitourinary:  [] Chronic kidney disease   [] Difficult urination  [] Frequent urination   [] Blood in urine Skin:  [] Rashes   [] Ulcers  Psychological:  [] History of anxiety   []  History of major depression.  Physical Examination  There were no vitals filed for this visit. There is no height or weight on file to calculate BMI. Gen: WD/WN, NAD Head: [place]/AT, No temporalis wasting.  Ear/Nose/Throat: Hearing grossly intact, nares w/o erythema or drainage, pinna without lesions Eyes: PER, EOMI, sclera nonicteric.  Neck:  Supple, no gross masses.  No JVD.  Pulmonary:  Good air movement, no audible wheezing, no use of accessory muscles.  Cardiac: RRR, precordium not hyperdynamic. Vascular:  Large varicosities present, greater than 10 mm right leg particularly in the posterior calf area.  Veins are tender to palpation  Mild venous stasis changes to the legs bilaterally.  Trace soft pitting edema CEAP C3sEpAsPr Vessel Right Left  Radial Palpable Palpable  Gastrointestinal: soft, non-distended. No guarding/no peritoneal signs.  Musculoskeletal: M/S 5/5 throughout.  No deformity.  Neurologic: CN 2-12 intact. Pain and light touch intact in extremities.  Symmetrical.  Speech is fluent. Motor exam as listed above. Psychiatric: Judgment intact, Mood & affect appropriate for pt's clinical situation. Dermatologic: Venous rashes no ulcers noted.  No changes consistent with cellulitis. Lymph : No lichenification or skin changes of chronic lymphedema.  CBC Lab Results  Component Value Date   WBC 10.3 11/24/2022   HGB 15.0 11/24/2022   HCT 43.8 11/24/2022   MCV 88.7 11/24/2022   PLT 173 11/24/2022    BMET    Component Value Date/Time   NA 140 11/24/2022 2243   NA 139 05/26/2022 1000   K 4.1 11/24/2022 2243   CL 103 11/24/2022 2243   CO2 27 11/24/2022 2243   GLUCOSE 116 (H) 11/24/2022 2243   BUN 11 11/24/2022 2243   BUN 12 05/26/2022 1000   CREATININE 1.24 11/24/2022 2243   CALCIUM 9.7 11/24/2022 2243   GFRNONAA >60 11/24/2022 2243   GFRAA 91 05/18/2020 1542   CrCl cannot be calculated (Patient's most recent lab result is older than the maximum 21 days allowed.).  COAG No results found for: "INR", "PROTIME"  Radiology No results found.   Assessment/Plan 1. Varicose veins with inflammation (Primary)  Recommend:  The patient has large symptomatic varicose veins that are painful and associated with swelling. The patient is CEAP C4sEpAsPr   I have had a long discussion with the patient regarding   varicose veins and why they cause symptoms.  Patient will begin wearing graduated compression stockings class 1 on a daily basis, beginning first thing in the morning and removing them in the evening. The patient is instructed specifically not to sleep in the stockings.    The patient  will also begin using over-the-counter analgesics such as Motrin 600 mg po TID to help control the symptoms.    In addition, behavioral modification including elevation during the day will be initiated.    Pending the results of these changes the  patient will be reevaluated in three months.   An ultrasound of the venous system will be obtained.   Further plans will be based on the ultrasound results and whether conservative therapies are successful at eliminating the pain and swelling.  - VAS US  LOWER EXTREMITY VENOUS REFLUX; Future    Devon Fogo, MD  12/22/2023 8:16 AM

## 2023-12-24 ENCOUNTER — Encounter (INDEPENDENT_AMBULATORY_CARE_PROVIDER_SITE_OTHER): Payer: Self-pay | Admitting: Vascular Surgery

## 2023-12-24 ENCOUNTER — Ambulatory Visit (INDEPENDENT_AMBULATORY_CARE_PROVIDER_SITE_OTHER): Payer: Self-pay | Admitting: Vascular Surgery

## 2023-12-24 VITALS — BP 126/85 | HR 85 | Resp 18 | Ht 70.0 in | Wt 280.4 lb

## 2023-12-24 DIAGNOSIS — I8310 Varicose veins of unspecified lower extremity with inflammation: Secondary | ICD-10-CM

## 2023-12-26 ENCOUNTER — Encounter (INDEPENDENT_AMBULATORY_CARE_PROVIDER_SITE_OTHER): Payer: Self-pay | Admitting: Vascular Surgery

## 2024-02-02 ENCOUNTER — Encounter (INDEPENDENT_AMBULATORY_CARE_PROVIDER_SITE_OTHER): Payer: Self-pay

## 2024-03-24 ENCOUNTER — Encounter (INDEPENDENT_AMBULATORY_CARE_PROVIDER_SITE_OTHER)

## 2024-03-24 ENCOUNTER — Ambulatory Visit (INDEPENDENT_AMBULATORY_CARE_PROVIDER_SITE_OTHER): Admitting: Vascular Surgery

## 2024-03-25 ENCOUNTER — Other Ambulatory Visit (INDEPENDENT_AMBULATORY_CARE_PROVIDER_SITE_OTHER): Payer: Self-pay | Admitting: Vascular Surgery

## 2024-03-25 DIAGNOSIS — I8310 Varicose veins of unspecified lower extremity with inflammation: Secondary | ICD-10-CM

## 2024-03-28 ENCOUNTER — Ambulatory Visit (INDEPENDENT_AMBULATORY_CARE_PROVIDER_SITE_OTHER): Admitting: Vascular Surgery

## 2024-03-28 ENCOUNTER — Encounter (INDEPENDENT_AMBULATORY_CARE_PROVIDER_SITE_OTHER): Payer: Self-pay | Admitting: Vascular Surgery

## 2024-03-28 ENCOUNTER — Other Ambulatory Visit (INDEPENDENT_AMBULATORY_CARE_PROVIDER_SITE_OTHER)

## 2024-03-28 VITALS — BP 122/83 | HR 80 | Resp 18 | Ht 70.0 in | Wt 270.0 lb

## 2024-03-28 DIAGNOSIS — I8311 Varicose veins of right lower extremity with inflammation: Secondary | ICD-10-CM | POA: Diagnosis not present

## 2024-03-28 DIAGNOSIS — I8310 Varicose veins of unspecified lower extremity with inflammation: Secondary | ICD-10-CM

## 2024-04-02 ENCOUNTER — Encounter (INDEPENDENT_AMBULATORY_CARE_PROVIDER_SITE_OTHER): Payer: Self-pay | Admitting: Vascular Surgery

## 2024-04-02 NOTE — Progress Notes (Signed)
 MRN : [id number]  Adam Wilcox is a 33 y.o. (1990-12-21) male who presents with chief complaint of varicose veins hurt.  History of Present Illness:   The patient returns for followup evaluation 3 months after the initial visit. The patient continues to have pain in the lower extremities with dependency. The pain is lessened with elevation. Graduated compression stockings, Class I (20-30 mmHg), have been worn but the stockings do not eliminate the leg pain. Over-the-counter analgesics do not improve the symptoms. The degree of discomfort continues to interfere with daily activities. The patient notes the pain in the legs is causing problems with daily exercise, at the workplace and even with household activities and maintenance such as standing in the kitchen preparing meals and doing dishes.   Venous ultrasound shows normal deep venous system, no evidence of acute or chronic DVT.  Superficial reflux is present in the right great and small saphenous veins  No outpatient medications have been marked as taking for the 03/28/24 encounter (Office Visit) with Jama, Cordella MATSU, MD.    Past Medical History:  Diagnosis Date   History of kidney stones    Hypercholesterolemia     Past Surgical History:  Procedure Laterality Date   EXTRACORPOREAL SHOCK WAVE LITHOTRIPSY Right 01/05/2023   Procedure: RIGHT EXTRACORPOREAL SHOCK WAVE LITHOTRIPSY (ESWL);  Surgeon: Matilda Senior, MD;  Location: [facility];  Service: Urology;  Laterality: Right;   WISDOM TOOTH EXTRACTION     age 67    Social History Social History   Tobacco Use   Smoking status: Never   Smokeless tobacco: Never  Vaping Use   Vaping status: Never Used  Substance Use Topics   Alcohol use: Yes    Alcohol/week: 3.0 standard drinks of alcohol    Types: 1 Cans of beer, 1 Glasses of wine, 1 Shots of liquor per week   Drug use: Never    Family History Family History  Problem Relation Age of  Onset   Diabetes Mother    Thyroid disease Mother    Anemia Mother    Cancer Maternal Grandfather    Cancer Paternal Grandfather     No Known Allergies   REVIEW OF SYSTEMS (Negative unless checked)  Constitutional: [] Weight loss  [] Fever  [] Chills Cardiac: [] Chest pain   [] Chest pressure   [] Palpitations   [] Shortness of breath when laying flat   [] Shortness of breath with exertion. Vascular:  [] Pain in legs with walking   [x] Pain in legs with standing  [] History of DVT   [] Phlebitis   [] Swelling in legs   [x] Varicose veins   [] Non-healing ulcers Pulmonary:   [] Uses home oxygen   [] Productive cough   [] Hemoptysis   [] Wheeze  [] COPD   [] Asthma Neurologic:  [] Dizziness   [] Seizures   [] History of stroke   [] History of TIA  [] Aphasia   [] Vissual changes   [] Weakness or numbness in arm   [] Weakness or numbness in leg Musculoskeletal:   [] Joint swelling   [] Joint pain   [] Low back pain Hematologic:  [] Easy bruising  [] Easy bleeding   [] Hypercoagulable state   [] Anemic Gastrointestinal:  [] Diarrhea   [] Vomiting  [] Gastroesophageal reflux/heartburn   [] Difficulty swallowing. Genitourinary:  [] Chronic kidney disease   [] Difficult urination  [] Frequent urination   [] Blood in urine Skin:  [] Rashes   [] Ulcers  Psychological:  [] History of anxiety   []  History of major depression.  Physical Examination  Vitals:   03/28/24 1529  BP: 122/83  Pulse: 80  Resp: 18  Weight: 270 lb (122.5 kg)  Height: 5' 10 (1.778 m)   Body mass index is 38.74 kg/m. Gen: WD/WN, NAD Head: [place]/AT, No temporalis wasting.  Ear/Nose/Throat: Hearing grossly intact, nares w/o erythema or drainage, pinna without lesions Eyes: PER, EOMI, sclera nonicteric.  Neck: Supple, no gross masses.  No JVD.  Pulmonary:  Good air movement, no audible wheezing, no use of accessory muscles.  Cardiac: RRR, precordium not hyperdynamic. Vascular:  Large varicosities present, greater than 10 mm right lower extremity.  Veins are tender  to palpation  Mild venous stasis changes to the legs bilaterally.  Trace soft pitting edema CEAP C3sEpAsPr Vessel Right Left  Radial Palpable Palpable  Gastrointestinal: soft, non-distended. No guarding/no peritoneal signs.  Musculoskeletal: M/S 5/5 throughout.  No deformity.  Neurologic: CN 2-12 intact. Pain and light touch intact in extremities.  Symmetrical.  Speech is fluent. Motor exam as listed above. Psychiatric: Judgment intact, Mood & affect appropriate for pt's clinical situation. Dermatologic: Venous rashes no ulcers noted.  No changes consistent with cellulitis. Lymph : No lichenification or skin changes of chronic lymphedema.  CBC Lab Results  Component Value Date   WBC 10.3 11/24/2022   HGB 15.0 11/24/2022   HCT 43.8 11/24/2022   MCV 88.7 11/24/2022   PLT 173 11/24/2022    BMET    Component Value Date/Time   NA 140 11/24/2022 2243   NA 139 05/26/2022 1000   K 4.1 11/24/2022 2243   CL 103 11/24/2022 2243   CO2 27 11/24/2022 2243   GLUCOSE 116 (H) 11/24/2022 2243   BUN 11 11/24/2022 2243   BUN 12 05/26/2022 1000   CREATININE 1.24 11/24/2022 2243   CALCIUM 9.7 11/24/2022 2243   GFRNONAA >60 11/24/2022 2243   GFRAA 91 05/18/2020 1542   CrCl cannot be calculated (Patient's most recent lab result is older than the maximum 21 days allowed.).  COAG No results found for: INR, PROTIME  Radiology VAS US  LOWER EXTREMITY VENOUS REFLUX Result Date: 03/28/2024  Lower Venous Reflux Study Patient Name:  Adam Wilcox  Date of Exam:   03/28/2024 Medical Rec #: [id number]    Accession #:    [id number] Date of Birth: 1991/09/03     Patient Gender: M Patient Age:   20 years Exam Location:  [facility] Procedure:      VAS US  LOWER EXTREMITY VENOUS REFLUX Referring Phys: CORDELLA SHAWL --------------------------------------------------------------------------------  Indications: Varicosities, Swelling, Edema, and Pain.  Performing Technologist: Donnice Charnley RVT   Examination Guidelines: A complete evaluation includes B-mode imaging, spectral Doppler, color Doppler, and power Doppler as needed of all accessible portions of each vessel. Bilateral testing is considered an integral part of a complete examination. Limited examinations for reoccurring indications may be performed as noted. The reflux portion of the exam is performed with the patient in reverse Trendelenburg. Significant venous reflux is defined as >500 ms in the superficial venous system, and >1 second in the deep venous system.  Venous Reflux Times +--------------+---------+------+-----------+------------+--------+ RIGHT         Reflux NoRefluxReflux TimeDiameter cmsComments                         Yes                                  +--------------+---------+------+-----------+------------+--------+  CFV                     yes   >1 second                      +--------------+---------+------+-----------+------------+--------+ FV prox       no                                             +--------------+---------+------+-----------+------------+--------+ FV mid        no                                             +--------------+---------+------+-----------+------------+--------+ FV dist       no                                             +--------------+---------+------+-----------+------------+--------+ Popliteal     no                                             +--------------+---------+------+-----------+------------+--------+ GSV at SFJ              yes    >500 ms      1.01             +--------------+---------+------+-----------+------------+--------+ GSV prox thigh          yes    >500 ms      0.82             +--------------+---------+------+-----------+------------+--------+ GSV mid thigh           yes    >500 ms      0.74             +--------------+---------+------+-----------+------------+--------+ GSV dist thighno                             0.54             +--------------+---------+------+-----------+------------+--------+ GSV at knee             yes    >500 ms      0.60             +--------------+---------+------+-----------+------------+--------+ GSV prox calf           yes    >500 ms      0.67             +--------------+---------+------+-----------+------------+--------+ SSV at [facility]    no                            0.74             +--------------+---------+------+-----------+------------+--------+ SSV prox calf           yes    >500 ms      0.57             +--------------+---------+------+-----------+------------+--------+ SSV mid calf  no  0.50             +--------------+---------+------+-----------+------------+--------+   Summary: Right: - No evidence of deep vein thrombosis seen in the right lower extremity, from the common femoral through the popliteal veins. - No evidence of superficial venous thrombosis in the right lower extremity. - Venous reflux is noted in the right common femoral vein. - Venous reflux is noted in the right sapheno-femoral junction. - Venous reflux is noted in the right greater saphenous vein in the thigh. - Venous reflux is noted in the right greater saphenous vein in the calf. - Venous reflux is noted in the right short saphenous vein.  *See table(s) above for measurements and observations. Electronically signed by Cordella Shawl MD on 03/28/2024 at 3:48:34 PM.    Final      Assessment/Plan 1. Varicose veins with inflammation (Primary) Recommend  I have reviewed my previous  discussion with the patient regarding  varicose veins and why they cause symptoms. Patient will continue  wearing graduated compression stockings class 1 on a daily basis, beginning first thing in the morning and removing them in the evening.  The patient is CEAP C3sEpAsPr.  The patient has been wearing compression for more than 12 weeks with no or little  benefit.  The patient has been exercising daily for more than 12 weeks. The patient has been elevating and taking OTC pain medications for more than 12 weeks.  None of these have have eliminated the pain related to the varicose veins and venous reflux or the discomfort regarding venous congestion.    In addition, behavioral modification including elevation during the day was again discussed and this will continue.  The patient has utilized over the counter pain medications and has been exercising.  However, at this time conservative therapy has not alleviated the patient's symptoms of leg pain and swelling  Recommend: laser ablation of the right great and small saphenous  veins to eliminate the symptoms of pain and swelling of the lower extremities caused by the severe superficial venous reflux disease.     Cordella Shawl, MD  04/02/2024 2:47 PM

## 2024-04-22 ENCOUNTER — Telehealth (INDEPENDENT_AMBULATORY_CARE_PROVIDER_SITE_OTHER): Payer: Self-pay | Admitting: Vascular Surgery

## 2024-04-22 ENCOUNTER — Other Ambulatory Visit (INDEPENDENT_AMBULATORY_CARE_PROVIDER_SITE_OTHER): Payer: Self-pay

## 2024-04-22 NOTE — Telephone Encounter (Signed)
 sent

## 2024-04-22 NOTE — Telephone Encounter (Signed)
 Patient is scheduled for a right leg SSV + GSV laser ablation with Dr. Jama on 9.4.25. Pt will need the standard RX protocol called in to [facility] [address] in [place]. Thanks!

## 2024-04-25 MED ORDER — ALPRAZOLAM 0.5 MG PO TABS: ORAL_TABLET | ORAL | 0 refills | Status: AC

## 2024-05-18 NOTE — Progress Notes (Unsigned)
    MRN : [id number]  Adam Wilcox is a 33 y.o. (11-30-1990) male who presents with chief complaint of No chief complaint on file. .    The patient's right lower extremity was sterilely prepped and draped.  The ultrasound machine was used to visualize the great saphenous vein throughout its course.  A segment of the great saphenous vein was selected for access.  The saphenous vein was accessed without difficulty using ultrasound guidance with a micropuncture needle.   An 0.018  wire was placed beyond the saphenofemoral junction through the sheath and the microneedle was removed.  The 65 cm sheath was then placed over the wire and the wire and dilator were removed.  The laser fiber was placed through the sheath and its tip was placed approximately 2 cm below the saphenofemoral junction.  Tumescent anesthesia was then created with a dilute lidocaine solution.  Laser energy was then delivered with constant withdrawal of the sheath and laser fiber.  Approximately 2046 Joules of energy were delivered over a length of 32 cm.    The patient's leg was then repositioned and reprepped and redraped in a sterile fashion. The small saphenous vein was then evaluated with ultrasound.   A segment mid calf was selected for access.  The saphenous vein was accessed without difficulty using ultrasound guidance with a micropuncture needle.   An 0.018  wire was placed beyond the saphenopopliteal junction through the sheath and the microneedle was removed.  The 65 cm sheath was then placed over the wire and the wire and dilator were removed.  The laser fiber was placed through the sheath and its tip was placed approximately 2 cm below the saphenopopliteal junction.  Tumescent anesthesia was then created with a dilute lidocaine solution.  Laser energy was then delivered with constant withdrawal of the sheath and laser fiber.  Approximately 822 Joules of energy were delivered over a length of 11 cm.    Sterile dressings were  placed.  The patient tolerated the procedure well without complications.

## 2024-05-19 ENCOUNTER — Ambulatory Visit (INDEPENDENT_AMBULATORY_CARE_PROVIDER_SITE_OTHER): Admitting: Vascular Surgery

## 2024-05-19 ENCOUNTER — Encounter (INDEPENDENT_AMBULATORY_CARE_PROVIDER_SITE_OTHER): Payer: Self-pay | Admitting: Vascular Surgery

## 2024-05-19 VITALS — BP 126/85 | HR 70 | Resp 18 | Wt 270.4 lb

## 2024-05-19 DIAGNOSIS — I8310 Varicose veins of unspecified lower extremity with inflammation: Secondary | ICD-10-CM

## 2024-05-19 DIAGNOSIS — I8311 Varicose veins of right lower extremity with inflammation: Secondary | ICD-10-CM

## 2024-05-24 ENCOUNTER — Other Ambulatory Visit (INDEPENDENT_AMBULATORY_CARE_PROVIDER_SITE_OTHER): Payer: Self-pay | Admitting: Vascular Surgery

## 2024-05-24 DIAGNOSIS — I8310 Varicose veins of unspecified lower extremity with inflammation: Secondary | ICD-10-CM

## 2024-05-25 ENCOUNTER — Telehealth (INDEPENDENT_AMBULATORY_CARE_PROVIDER_SITE_OTHER): Payer: Self-pay

## 2024-05-25 ENCOUNTER — Ambulatory Visit (INDEPENDENT_AMBULATORY_CARE_PROVIDER_SITE_OTHER)

## 2024-05-25 DIAGNOSIS — I8311 Varicose veins of right lower extremity with inflammation: Secondary | ICD-10-CM

## 2024-05-25 DIAGNOSIS — I8310 Varicose veins of unspecified lower extremity with inflammation: Secondary | ICD-10-CM

## 2024-05-25 NOTE — Telephone Encounter (Signed)
 Patient reach out to the office stating that he is having tenderness in the inner right thigh, increase pain, and affecting his walking. Patient had right gsv laser on 05/19/24. Patient post laser ultrasound will be move up to today.

## 2024-05-26 ENCOUNTER — Encounter (INDEPENDENT_AMBULATORY_CARE_PROVIDER_SITE_OTHER)

## 2024-06-16 ENCOUNTER — Ambulatory Visit (INDEPENDENT_AMBULATORY_CARE_PROVIDER_SITE_OTHER): Admitting: Nurse Practitioner

## 2024-06-16 ENCOUNTER — Encounter (INDEPENDENT_AMBULATORY_CARE_PROVIDER_SITE_OTHER): Payer: Self-pay | Admitting: Nurse Practitioner

## 2024-06-16 VITALS — BP 135/89 | HR 86 | Resp 18 | Wt 273.6 lb

## 2024-06-16 DIAGNOSIS — I8310 Varicose veins of unspecified lower extremity with inflammation: Secondary | ICD-10-CM | POA: Diagnosis not present

## 2024-06-16 NOTE — Progress Notes (Signed)
 Subjective:    Patient ID: Adam Wilcox, male    DOB: 10-09-1990, 33 y.o.   MRN: [id number] Chief Complaint  Patient presents with   Follow-up    4 week post laser    The patient returns to the office for followup status post laser ablation of the right great and small  saphenous vein on 05/19/2024.  The patient note significant improvement in the lower extremity pain but not resolution of the symptoms. The patient notes multiple residual varicosities bilaterally which continued to hurt with dependent positions and remained tender to palpation. The patient's swelling is minimally from preoperative status. The patient continues to wear graduated compression stockings on a daily basis but these are not eliminating the pain and discomfort. The patient continues to use over-the-counter anti-inflammatory medications to treat the pain and related symptoms but this has not given the patient relief. The patient notes the pain in the lower extremities is causing problems with daily exercise, problems at work and even with household activities such as preparing meals and doing dishes.  The patient is otherwise done well and there have been no complications related to the laser procedure or interval changes in the patient's overall   Post laser ultrasound shows successful ablation of the right gsv and ssv      Review of Systems  Cardiovascular:  Positive for leg swelling.  All other systems reviewed and are negative.      Objective:   Physical Exam Vitals reviewed.  HENT:     Head: Normocephalic.  Cardiovascular:     Rate and Rhythm: Normal rate.  Pulmonary:     Effort: Pulmonary effort is normal.  Musculoskeletal:        General: Tenderness present.  Skin:    General: Skin is warm and dry.  Neurological:     Mental Status: He is alert and oriented to person, place, and time.  Psychiatric:        Mood and Affect: Mood normal.        Behavior: Behavior normal.        Thought Content:  Thought content normal.        Judgment: Judgment normal.     BP 135/89   Pulse 86   Resp 18   Wt 273 lb 9.6 oz (124.1 kg)   BMI 39.26 kg/m   Past Medical History:  Diagnosis Date   History of kidney stones    Hypercholesterolemia     Social History   Socioeconomic History   Marital status: Married    Spouse name: Not on file   Number of children: Not on file   Years of education: Not on file   Highest education level: Not on file  Occupational History    Employer: [employer]  Tobacco Use   Smoking status: Never   Smokeless tobacco: Never  Vaping Use   Vaping status: Never Used  Substance and Sexual Activity   Alcohol use: Yes    Alcohol/week: 3.0 standard drinks of alcohol    Types: 1 Cans of beer, 1 Glasses of wine, 1 Shots of liquor per week   Drug use: Never   Sexual activity: Not on file  Other Topics Concern   Not on file  Social History Narrative   Not on file   Social Drivers of Health   Financial Resource Strain: Not on file  Food Insecurity: Not on file  Transportation Needs: Not on file  Physical Activity: Not on file  Stress: Not  on file  Social Connections: Not on file  Intimate Partner Violence: Not on file    Past Surgical History:  Procedure Laterality Date   EXTRACORPOREAL SHOCK WAVE LITHOTRIPSY Right 01/05/2023   Procedure: RIGHT EXTRACORPOREAL SHOCK WAVE LITHOTRIPSY (ESWL);  Surgeon: Matilda Senior, MD;  Location: [facility];  Service: Urology;  Laterality: Right;   WISDOM TOOTH EXTRACTION     age 6    Family History  Problem Relation Age of Onset   Diabetes Mother    Thyroid disease Mother    Anemia Mother    Cancer Maternal Grandfather    Cancer Paternal Grandfather     No Known Allergies     Latest Ref Rng & Units 11/24/2022   10:43 PM 05/18/2020    3:42 PM 05/14/2018    3:41 PM  CBC  WBC 4.0 - 10.5 K/uL 10.3  7.3  8.2   Hemoglobin 13.0 - 17.0 g/dL 84.9  84.6  85.0   Hematocrit 39.0 - 52.0  % 43.8  44.1  43.3   Platelets 150 - 400 K/uL 173  177  188       CMP     Component Value Date/Time   NA 140 11/24/2022 2243   NA 139 05/26/2022 1000   K 4.1 11/24/2022 2243   CL 103 11/24/2022 2243   CO2 27 11/24/2022 2243   GLUCOSE 116 (H) 11/24/2022 2243   BUN 11 11/24/2022 2243   BUN 12 05/26/2022 1000   CREATININE 1.24 11/24/2022 2243   CALCIUM 9.7 11/24/2022 2243   PROT 7.3 11/24/2022 2243   PROT 6.7 05/26/2022 1000   ALBUMIN 4.2 11/24/2022 2243   ALBUMIN 4.3 05/26/2022 1000   AST 32 11/24/2022 2243   ALT 40 11/24/2022 2243   ALKPHOS 76 11/24/2022 2243   BILITOT 0.4 11/24/2022 2243   BILITOT 0.4 05/26/2022 1000   EGFR 95 05/26/2022 1000   GFRNONAA >60 11/24/2022 2243     No results found.     Assessment & Plan:   1. Varicose veins with inflammation (Primary) Recommend:  The patient has had successful ablation of the previously incompetent saphenous venous system but still has persistent symptoms of pain and swelling that are having a negative impact on daily life and daily activities.  Patient should undergo injection sclerotherapy to treat the residual varicosities.  The risks, benefits and alternative therapies were reviewed in detail with the patient.  All questions were answered.  The patient agrees to proceed with sclerotherapy at their convenience.  The patient will continue wearing the graduated compression stockings and using the over-the-counter pain medications to treat her symptoms.       Current Outpatient Medications on File Prior to Visit  Medication Sig Dispense Refill   ALPRAZolam  (XANAX ) 0.5 MG tablet Take 1st tablet 1 hour before procedure then take 2nd tablet once arrived at the office (Patient not taking: Reported on 06/16/2024) 2 tablet 0   dextromethorphan-guaiFENesin (MUCINEX DM) 30-600 MG 12hr tablet Take 1 tablet by mouth 2 (two) times daily. (Patient not taking: Reported on 06/16/2024)     Omega-3 Fatty Acids (FISH OIL PO) Take by  mouth. (Patient not taking: Reported on 06/16/2024)     oxyCODONE -acetaminophen  (PERCOCET) 5-325 MG tablet Take 1 tablet by mouth every 6 (six) hours as needed for severe pain. (Patient not taking: Reported on 06/16/2024) 10 tablet 0   tamsulosin  (FLOMAX ) 0.4 MG CAPS capsule Take 1 capsule (0.4 mg total) by mouth daily. (Patient not taking: Reported on  06/16/2024) 10 capsule 0   No current facility-administered medications on file prior to visit.    There are no Patient Instructions on file for this visit. No follow-ups on file.   Renae Mottley E Vikrant Pryce, NP

## 2024-06-28 ENCOUNTER — Telehealth (INDEPENDENT_AMBULATORY_CARE_PROVIDER_SITE_OTHER): Payer: Self-pay | Admitting: Nurse Practitioner

## 2024-06-28 NOTE — Telephone Encounter (Signed)
 LVM for pt TCB and schedule SALINE sclero appt with Orvin Daring, NP  RLE SALINE sclero. see fb. no auth req X 3

## 2024-07-25 ENCOUNTER — Telehealth (INDEPENDENT_AMBULATORY_CARE_PROVIDER_SITE_OTHER): Payer: Self-pay | Admitting: Nurse Practitioner

## 2024-07-25 NOTE — Telephone Encounter (Signed)
 Called pt 2x and Lvm for scheduling Sclerotherapy with Delores Pickles, NP.   RLE SALINE sclero. see fb. no auth req X 3 units
